# Patient Record
Sex: Female | Born: 1995 | Race: White | Hispanic: No | Marital: Single | Smoking: Never smoker
Health system: Southern US, Community
[De-identification: ages and names within clinical notes are randomized; demographics above are authoritative.]

---

## 2017-02-01 ENCOUNTER — Emergency Department
Admission: EM | Admit: 2017-02-01 | Discharge: 2017-02-01 | Disposition: A | Discharge status: Home / Self Care | Payer: BLUE CROSS/BLUE SHIELD | Attending: Emergency Medicine | Admitting: Emergency Medicine

## 2017-02-01 ENCOUNTER — Emergency Department: Payer: BLUE CROSS/BLUE SHIELD

## 2017-02-01 ENCOUNTER — Other Ambulatory Visit: Payer: Self-pay

## 2017-02-01 ENCOUNTER — Encounter: Payer: Self-pay | Admitting: Emergency Medicine

## 2017-02-01 DIAGNOSIS — K5900 Constipation, unspecified: Secondary | ICD-10-CM | POA: Insufficient documentation

## 2017-02-01 DIAGNOSIS — R110 Nausea: Secondary | ICD-10-CM | POA: Insufficient documentation

## 2017-02-01 DIAGNOSIS — R109 Unspecified abdominal pain: Secondary | ICD-10-CM | POA: Diagnosis present

## 2017-02-01 DIAGNOSIS — R1031 Right lower quadrant pain: Secondary | ICD-10-CM | POA: Diagnosis not present

## 2017-02-01 LAB — CBC
HCT: 42 % (ref 35.0–47.0)
HEMOGLOBIN: 14.1 g/dL (ref 12.0–16.0)
MCH: 30.4 pg (ref 26.0–34.0)
MCHC: 33.4 g/dL (ref 32.0–36.0)
MCV: 90.8 fL (ref 80.0–100.0)
Platelets: 255 10*3/uL (ref 150–440)
RBC: 4.63 MIL/uL (ref 3.80–5.20)
RDW: 12.5 % (ref 11.5–14.5)
WBC: 14.8 10*3/uL — AB (ref 3.6–11.0)

## 2017-02-01 LAB — URINALYSIS, COMPLETE (UACMP) WITH MICROSCOPIC
BILIRUBIN URINE: NEGATIVE
Bacteria, UA: NONE SEEN
Glucose, UA: NEGATIVE mg/dL
HGB URINE DIPSTICK: NEGATIVE
Ketones, ur: NEGATIVE mg/dL
Leukocytes, UA: NEGATIVE
NITRITE: NEGATIVE
PROTEIN: NEGATIVE mg/dL
Specific Gravity, Urine: 1.013 (ref 1.005–1.030)
pH: 6 (ref 5.0–8.0)

## 2017-02-01 LAB — COMPREHENSIVE METABOLIC PANEL
ALK PHOS: 51 U/L (ref 38–126)
ALT: 14 U/L (ref 14–54)
ANION GAP: 7 (ref 5–15)
AST: 25 U/L (ref 15–41)
Albumin: 4.8 g/dL (ref 3.5–5.0)
BILIRUBIN TOTAL: 1.1 mg/dL (ref 0.3–1.2)
BUN: 8 mg/dL (ref 6–20)
CALCIUM: 9.8 mg/dL (ref 8.9–10.3)
CO2: 25 mmol/L (ref 22–32)
Chloride: 106 mmol/L (ref 101–111)
Creatinine, Ser: 0.7 mg/dL (ref 0.44–1.00)
GFR calc non Af Amer: >60 mL/min (ref >60)
Glucose, Bld: 97 mg/dL (ref 65–99)
Potassium: 4 mmol/L (ref 3.5–5.1)
SODIUM: 138 mmol/L (ref 135–145)
TOTAL PROTEIN: 7.9 g/dL (ref 6.5–8.1)

## 2017-02-01 LAB — LIPASE, BLOOD: Lipase: 36 U/L (ref 11–51)

## 2017-02-01 LAB — POCT PREGNANCY, URINE: PREG TEST UR: NEGATIVE

## 2017-02-01 MED ORDER — ONDANSETRON HCL 4 MG/2ML IJ SOLN
4.0000 mg | Freq: Once | INTRAMUSCULAR | Status: AC
  Administered 2017-02-01: 4 mg via INTRAVENOUS
  Filled 2017-02-01: qty 2

## 2017-02-01 MED ORDER — IOPAMIDOL (ISOVUE-300) INJECTION 61%
100.0000 mL | Freq: Once | INTRAVENOUS | Status: AC | PRN
  Administered 2017-02-01: 100 mL via INTRAVENOUS

## 2017-02-01 MED ORDER — KETOROLAC TROMETHAMINE 30 MG/ML IJ SOLN
15.0000 mg | Freq: Once | INTRAMUSCULAR | Status: AC
  Administered 2017-02-01: 15 mg via INTRAVENOUS
  Filled 2017-02-01: qty 1

## 2017-02-01 MED ORDER — SODIUM CHLORIDE 0.9 % IV BOLUS (SEPSIS)
500.0000 mL | Freq: Once | INTRAVENOUS | Status: AC
  Administered 2017-02-01: 500 mL via INTRAVENOUS

## 2017-02-01 NOTE — ED Triage Notes (Signed)
Abdominal pain this am which was severe x 2 hours. States has decreased now but still present.

## 2017-02-01 NOTE — Discharge Instructions (Signed)
You have been seen in the Emergency Department (ED) for abdominal pain.  Your evaluation did not identify a clear cause of your symptoms but was generally reassuring.  Abdominal pain has many possible causes. Some aren't serious and get better on their own in a few days. Others need more testing and treatment. If your pain continues or gets worse, you need to be rechecked and may need more tests to find out what is wrong. You may need surgery to correct the problem.   Follow up with your doctor or come back to the ER in 12-24 hours if you are still having abdominal pain. Otherwise follow up in 1-3 days for a re-check. Return to the ER immediately if your pain gets worse, if you develop a fever or if you start to vomit.  In the meantime, in order to treat the constipation, dissolve 3 caps full of Miralax into 500cc of water/juice/gatorade and take it over the course of the day. Repeat for 3-5 days as needed for constipation. Make sure to drink plenty of liquids as Miralax can cause dehydration. You may also try over the counter probiotics on a daily basis and increase fiber in your diet to help relieve the constipation.  Follow-up with their pediatrician in 12-24 hours if your child is still having abdominal pain otherwise follow up in 2-3 days to make sure they are improving.  Return to the emergency department if your child has multiple episodes of vomiting and or diarrhea concerning for dehydration (sunken eyes, crying without tears, decreased level of activity, dry mouth), has green vomit, blood in the vomit, blood in the bowel movements, develops fever > 101, has new or changing abdominal pain that is worse in one specific area especially the right lower quadrant, appears lethargic or difficult to wake up, or has any other signs that concern you.  Don't ignore new symptoms, such as fever, nausea and vomiting, new or worsening abdominal pain, urination problems, bloody diarrhea or bloody stools, black  tarry stools, uncontrollable nausea and vomiting, and dizziness. These may be signs of a more serious problem. If you develop any of these you should be seen by your doctor immediately or return to the ED.   How can you care for yourself at home?  Rest until you feel better.  To prevent dehydration, drink plenty of fluids, enough so that your urine is light yellow or clear like water. Choose water and other caffeine-free clear liquids until you feel better. If you have kidney, heart, or liver disease and have to limit fluids, talk with your doctor before you increase the amount of fluids you drink.  If your stomach is upset, eat mild foods, such as rice, dry toast or crackers, bananas, and applesauce. Try eating several small meals instead of two or three large ones.  Wait until 48 hours after all symptoms have gone away before you have spicy foods, alcohol, and drinks that contain caffeine.  Do not eat foods that are high in fat.  Avoid anti-inflammatory medicines such as aspirin, ibuprofen (Advil, Motrin), and naproxen (Aleve). These can cause stomach upset. Talk to your doctor if you take daily aspirin for another health problem.  When should you call for help?  Call 911 anytime you think you may need emergency care. For example, call if:  You passed out (lost consciousness).  You pass maroon or very bloody stools.  You vomit blood or what looks like coffee grounds.  You have new, severe belly pain.  Call your doctor now or seek immediate medical care if:  Your pain gets worse, especially if it becomes focused in one area of your belly.  You have a new or higher fever.  Your stools are black and look like tar, or they have streaks of blood.  You have unexpected vaginal bleeding.  You have symptoms of a urinary tract infection. These may include:  Pain when you urinate.  Urinating more often than usual.  Blood in your urine. You are dizzy or lightheaded, or you feel like you may  faint. Watch closely for changes in your health, and be sure to contact your doctor if:  You are not getting better after 1 day (24 hours).

## 2017-02-01 NOTE — Consult Note (Signed)
SURGICAL CONSULTATION NOTE (initial) - cpt: 99244  HISTORY OF PRESENT ILLNESS (HPI):  21 y.o. female [facility] for evaluation of abdominal pain. Patient reports generalized abdominal pain worst in her RUQ awoke her from sleep at 10 am this morning, 2 hours earlier than she normally awakes on Saturdays, and then became more focused at her RLQ before improving to 2/10 intermittent crampy abdominal pain, but not completely resolving, for which [facility]. While in the ED, patient has not received any additional pain medication since being given a single dose of Toradol 15 mg 3 hours ago. Patient also reports mild nausea without emesis and states she hasn't yet had a BM today and has not been passing normal amounts of flatus throughout the day. She otherwise denies any fever/chills, burning with urination, or prior similar episodes.  Surgery is consulted by ED physician Dr. Don PerkingVeronese in this context for evaluation and management of RLQ abdominal pain.  PAST MEDICAL HISTORY (PMH):  History reviewed. No pertinent past medical history.   PAST SURGICAL HISTORY (PSH):  History reviewed. No pertinent surgical history.   MEDICATIONS:  Prior to Admission medications   Not on File     ALLERGIES:  No Known Allergies   SOCIAL HISTORY:  Social History   Socioeconomic History  . Marital status: Single    Spouse name: Not on file  . Number of children: Not on file  . Years of education: Not on file  . Highest education level: Current Archivistcollege student at [employer]  Social Needs  . Financial resource strain: Not on file  . Food insecurity - worry: Not on file  . Food insecurity - inability: Not on file  . Transportation needs - medical: Not on file  . Transportation needs - non-medical: Not on file  Occupational History  . Not on file  Tobacco Use  . Smoking status: Never Smoker  Substance and Sexual Activity  . Alcohol use: Not on file  . Drug use: Not on file   . Sexual activity: Not on file  Other Topics Concern  . Not on file  Social History Narrative  . Not on file    The patient currently resides (home / rehab facility / nursing home): Home The patient normally is (ambulatory / bedbound): Ambulatory   FAMILY HISTORY:  No family history on file.   REVIEW OF SYSTEMS:  Constitutional: denies weight loss, fever, chills, or sweats  Eyes: denies any other vision changes, history of eye injury  ENT: denies sore throat, hearing problems  Respiratory: denies shortness of breath, wheezing  Cardiovascular: denies chest pain, palpitations  Gastrointestinal: abdominal pain, N/V, and bowel function as per HPI Genitourinary: denies burning with urination or urinary frequency Musculoskeletal: denies any other joint pains or cramps  Skin: denies any other rashes or skin discolorations  Neurological: denies any other headache, dizziness, weakness  Psychiatric: denies any other depression, anxiety   All other review of systems were negative   VITAL SIGNS:  Temp:  [97.7 F (36.5 C)] 97.7 F (36.5 C) (11/10 1540) Pulse Rate:  [87-89] 89 (11/10 1957) Resp:  [20] 20 (11/10 1957) BP: (120-133)/(76-82) 120/82 (11/10 1957) SpO2:  [96 %-100 %] 100 % (11/10 1957) Weight:  [150 lb (68 kg)] 150 lb (68 kg) (11/10 1542)     Height: 5\' 11"  (180.3 cm) Weight: 150 lb (68 kg) BMI (Calculated): 20.93   INTAKE/OUTPUT:  This shift: No intake/output data recorded.  Last 2 shifts: @IOLAST2SHIFTS @  PHYSICAL EXAM:  Constitutional:  -- Normal body habitus  -- Awake, alert, and oriented x3, no apparent distress Eyes:  -- Pupils equally round and reactive to light  -- No scleral icterus, B/L no occular discharge Ear, nose, throat: -- Neck is FROM WNL -- No jugular venous distension  Pulmonary:  -- No wheezes or rhales -- Equal breath sounds bilaterally -- Breathing non-labored at rest Cardiovascular:  -- S1, S2 present  -- No pericardial rubs   Gastrointestinal:  -- Abdomen soft and non-distended with mild LLQ tenderness to palpation, RLQ currently non-tender, no guarding or rebound tenderness -- No abdominal masses appreciated, pulsatile or otherwise  Musculoskeletal and Integumentary:  -- Wounds or skin discoloration: None appreciated -- Extremities: B/L UE and LE FROM, hands and feet warm, no edema  Neurologic:  -- Motor function: Intact and symmetric -- Sensation: Intact and symmetric Psychiatric:  -- Mood and affect WNL  Labs:  CBC Latest Ref Rng & Units 02/01/2017  WBC 3.6 - 11.0 K/uL 14.8(H)  Hemoglobin 12.0 - 16.0 g/dL 96.0  Hematocrit 45.4 - 47.0 % 42.0  Platelets 150 - 440 K/uL 255   CMP Latest Ref Rng & Units 02/01/2017  Glucose 65 - 99 mg/dL 97  BUN 6 - 20 mg/dL 8  Creatinine 0.98 - 1.19 mg/dL 1.47  Sodium 829 - 562 mmol/L 138  Potassium 3.5 - 5.1 mmol/L 4.0  Chloride 101 - 111 mmol/L 106  CO2 22 - 32 mmol/L 25  Calcium 8.9 - 10.3 mg/dL 9.8  Total Protein 6.5 - 8.1 g/dL 7.9  Total Bilirubin 0.3 - 1.2 mg/dL 1.1  Alkaline Phos 38 - 126 U/L 51  AST 15 - 41 U/L 25  ALT 14 - 54 U/L 14   Imaging studies:  CT Abdomen and Pelvis with Contrast (02/01/2017) - personally reviewed and discussed with patient and her friends (bedside) 1. Moderate free fluid in the pelvis is nonspecific but may be physiologic. 2. The appendix is not seen but there is no secondary evidence of appendicitis. 3. Two calcifications in the nondependent bladder are nonspecific. There is no associated mass or wall thickening. These may be from previous inflammation. These are of doubtful acute significance. 4. Moderate fecal loading in the colon.  Trans-Abdominal and Trans-Vaginal Pelvic Ultrasound (02/01/2017) 1. The uterus and ovaries are normal in appearance. Symmetric ovarian blood flow identified. The small amount of free fluid in the pelvis is nonspecific but may simply be physiologic.   Assessment/Plan: (ICD-10's: R10/31) 21  y.o. otherwise female with variable improved crampy intermittent abdominal pain associated with constipation and non-visualization of patient's appendix without surrounding inflammatory changes and mild-moderate leukocytosis (WBC 14.8).   - considering patient's improved crampy intermittent abdominal pain and lack of peri-appendiceal inflammatory changes on CT imaging, acute appendicitis appears less likely, though cannot be entirely excluded  - also considering patient's prolonged ED workup, she has essentially been monitored/observed without antibiotics or additional pain medication   - currently no indication for urgent or emergent surgical intervention at this time, patient expresses she'd like to go home  - if discharged home, recommend close follow-up within the next 48 hours or so and consider bowel regimen  - indications for returning to ED or PMD discussed with patient and her friends (bedside)  - antibiotics and disposition as per ED physician  All of the above findings and recommendations were discussed with the patient and her friends (bedside), and all of patient's and her friends' questions were answered to their expressed satisfaction.  Thank you for the opportunity to participate in this patient's care.   -- Scherrie GerlachJason E. Earlene Plateravis, MD, RPVI [place]: [facility] General Surgery - Partnering for exceptional care. Office: [phone]

## 2017-02-01 NOTE — ED Provider Notes (Signed)
[facility] Emergency Department Provider Note  ____________________________________________  Time seen: Approximately 6:01 PM  I have reviewed the triage vital signs and the nursing notes.   HISTORY  Chief Complaint Abdominal Pain   HPI Chelsea Cox is a 21 y.o. female no significant past medical history who presents for evaluation of abdominal pain. Patient reports that she woke up this morning with a severe sharp right upper quadrant abdominal pain. The pain lasted 2-1/2 hours and has now migrated to the right lower quadrant. Her pain is now mild 2 out of 10 however she has been having intermittent episodes of moderate sharp pain. Has had nausea but no vomiting, no constipation or diarrhea, no vaginal bleeding or discharge, no dysuria or hematuria. LMP was a week ago. Patient denies prior history of kidney stones or ovarian cysts. No prior abdominal surgeries.  History reviewed. No pertinent past medical history.  There are no active problems to display for this patient.   History reviewed. No pertinent surgical history.  Prior to Admission medications   Not on File    Allergies Patient has no known allergies.  No family history on file.  Social History Social History   Tobacco Use  . Smoking status: Never Smoker  Substance Use Topics  . Alcohol use: Not on file  . Drug use: Not on file    Review of Systems  Constitutional: Negative for fever. Eyes: Negative for visual changes. ENT: Negative for sore throat. Neck: No neck pain  Cardiovascular: Negative for chest pain. Respiratory: Negative for shortness of breath. Gastrointestinal: + r sided abdominal pain and nausea. No vomiting or diarrhea. Genitourinary: Negative for dysuria. Musculoskeletal: Negative for back pain. Skin: Negative for rash. Neurological: Negative for headaches, weakness or numbness. Psych: No SI or  HI  ____________________________________________   PHYSICAL EXAM:  VITAL SIGNS: ED Triage Vitals  Enc Vitals Group     BP 02/01/17 1540 133/76     Pulse Rate 02/01/17 1540 87     Resp 02/01/17 1540 20     Temp 02/01/17 1540 97.7 F (36.5 C)     Temp Source 02/01/17 1540 Oral     SpO2 02/01/17 1540 96 %     Weight 02/01/17 1542 150 lb (68 kg)     Height 02/01/17 1542 5\' 11"  (1.803 m)     Head Circumference --      Peak Flow --      Pain Score 02/01/17 1540 2     Pain Loc --      Pain Edu? --      Excl. in GC? --     Constitutional: Alert and oriented. Well appearing and in no apparent distress. HEENT:      Head: Normocephalic and atraumatic.         Eyes: Conjunctivae are normal. Sclera is non-icteric.       Mouth/Throat: Mucous membranes are moist.       Neck: Supple with no signs of meningismus. Cardiovascular: Regular rate and rhythm. No murmurs, gallops, or rubs. 2+ symmetrical distal pulses are present in all extremities. No JVD. Respiratory: Normal respiratory effort. Lungs are clear to auscultation bilaterally. No wheezes, crackles, or rhonchi.  Gastrointestinal: Soft, ttp over the RLQ, and non distended with positive bowel sounds. No rebound or guarding. Genitourinary: No CVA tenderness. Musculoskeletal: Nontender with normal range of motion in all extremities. No edema, cyanosis, or erythema of extremities. Neurologic: Normal speech and language. Face is symmetric. Moving all extremities.  No gross focal neurologic deficits are appreciated. Skin: Skin is warm, dry and intact. No rash noted. Psychiatric: Mood and affect are normal. Speech and behavior are normal.  ____________________________________________   LABS (all labs ordered are listed, but only abnormal results are displayed)  Labs Reviewed  CBC - Abnormal; Notable for the following components:      Result Value   WBC 14.8 (*)    All other components within normal limits  URINALYSIS, COMPLETE (UACMP)  WITH MICROSCOPIC - Abnormal; Notable for the following components:   Color, Urine YELLOW (*)    APPearance CLEAR (*)    Squamous Epithelial / LPF 0-5 (*)    All other components within normal limits  LIPASE, BLOOD  COMPREHENSIVE METABOLIC PANEL  POC URINE PREG, ED  POCT PREGNANCY, URINE   ____________________________________________  EKG  none  ____________________________________________  RADIOLOGY  US renal: Normal study. No hydronephrosis or stone identified   TVUS: The uterus and ovaries are normal in appearance. Symmetric ovarian blood flow identified. The small amount of free fluid in the pelvis is nonspecific but may simply be physiologic.  CT a/p: 1. Moderate free fluid in the pelvis is nonspecific but may be physiologic. 2. The appendix is not seen but there is no secondary evidence of appendicitis. 3. Two calcifications in the nondependent bladder are nonspecific. There is no associated mass or wall thickening. These may be from previous inflammation. These are of doubtful acute significance. 4. Moderate fecal loading in the colon. ____________________________________________   PROCEDURES  Procedure(s) performed: None Procedures Critical Care performed:  None ____________________________________________   INITIAL IMPRESSION / ASSESSMENT AND PLAN / ED COURSE  21 y.o. female no significant past medical history who presents for evaluation of R sided abdominal pain since this morning  Initially in RUQ and now in RLQ associated with nausea. Patient is well-appearing, in no distress, is normal vital signs, abdomen is soft with tenderness palpation on the right lower quadrant, no rebound or guarding. Based on patient's description of the pain severe on onset, colicky in nature, and migrating my differential diagnoses includes kidney stone versus ovarian torsion versus ovarian cyst. Also the differential diagnosis is appendicitis since patient is tender on the right  lower quadrant or possible gallbladder however there is no tenderness in the right upper quadrant and patient has normal LFTs and lipase. She does have leukocytosis with white count of 14. We'll give her Toradol, Zofran, and fluids for symptom relief. We'll send patient for ultrasound renal and transvaginal to rule out ovarian torsion or hydronephrosis/kidney stone.    _________________________ 10:09 PM on 02/01/2017 -----------------------------------------  Transvaginal ultrasound negative for ovarian torsion or cysts. Renal ultrasound was also negative. Patient was sent for CT abdomen and pelvis since he continued to be tender to palpation on the right lower quadrant. CT unable to visualize the appendix however no secondary evidence of appendicitis. This patient had an elevated white count without an alternative etiology for her pain and persistent right lower quadrant tenderness surgery was consultative. Patient was evaluated by Dr. Earlene Plater who looks at her labs, imaging and during his evaluation noted the patient's tenderness had been moved from the right lower quadrant to the left lower quadrant making appendicitis less likely at this time. He recommended discharging patient home with close follow-up in 24 hours for recheck. Patient noted to have large colonic stool burden, will treat with Miralax at home. Discussed signs and symptoms of intermittent torsion or appendicitis with patient and recommended that she returns to  the emergency room if her pain gets worse, she develops fever, or nausea and vomiting. Otherwise patient's, [facility] in [facility]-24 hours for recheck of her abdominal exam and vital signs. Patient is comfortable with this plan. We'll discharge her home at this time.   Nursing notes reviewed and incorporated, Labs reviewed , Radiograph reviewed , A consult was requested and obtained from this/these consultant(s) Surgery, Notes from prior ED visits and [place] Controlled  Substance Database   Pertinent labs & imaging results that were available during my care of the patient were reviewed by me and considered in my medical decision making (see chart for details).    ____________________________________________   FINAL CLINICAL IMPRESSION(S) / ED DIAGNOSES  Final diagnoses:  Right sided abdominal pain  Constipation, unspecified constipation type      NEW MEDICATIONS STARTED DURING THIS VISIT:  This SmartLink is deprecated. Use AVSMEDLIST instead to display the medication list for a patient.   Note:  This document was prepared using Dragon voice recognition software and may include unintentional dictation errors.    Nita SickleVeronese, [place], MD 02/01/17 2212

## 2017-02-01 NOTE — ED Notes (Signed)
UA PREG result = NEG

## 2017-02-01 NOTE — ED Notes (Signed)
Ice bag applied to infiltrated right AC IV for comfort. Pt has little visible swelling noted.

## 2018-07-23 IMAGING — US US RENAL
1 series · 14 of 25 positions shown · non-contrast
Comparison: None.

CLINICAL DATA: Right-sided abdominal pain. Evaluate for renal
stone/hydronephrosis.

EXAM:
RENAL / URINARY TRACT ULTRASOUND COMPLETE

[Series 1: us renal · 0.22mm/px · 14 of 32 slices shown]
[im 1/32]
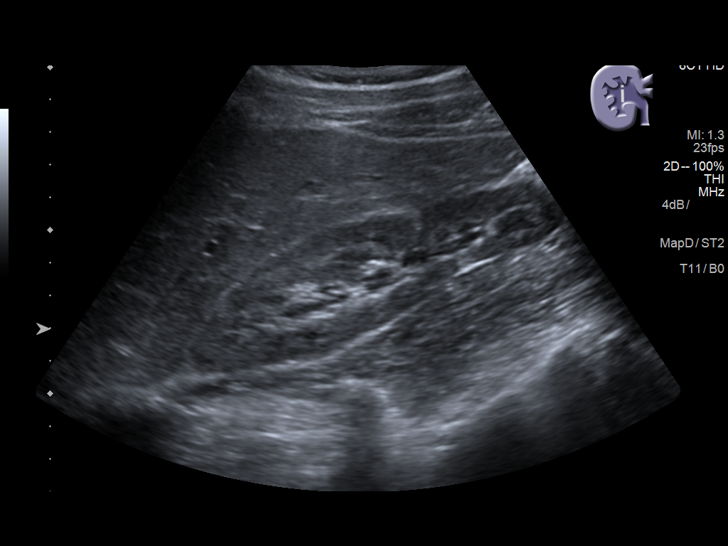
[im 3/32]
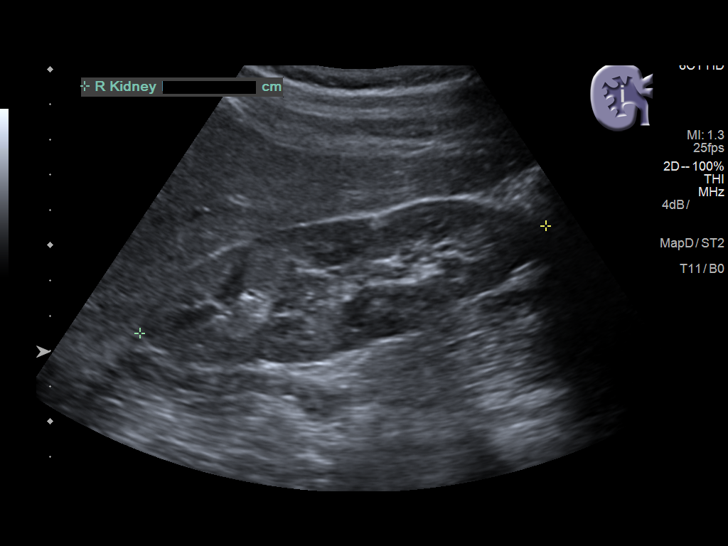
[im 6/32]
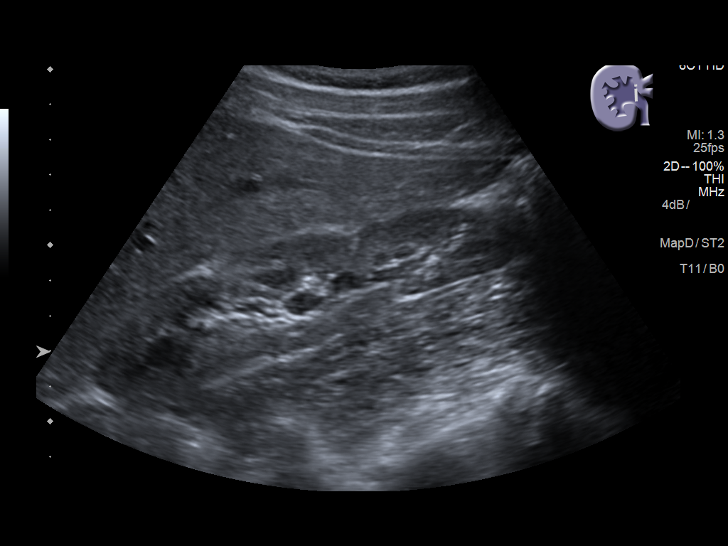
[im 8/32]
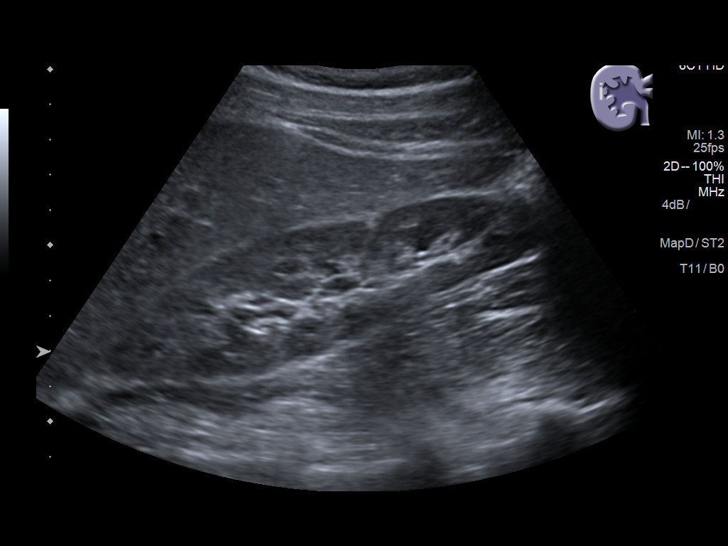
[im 11/32]
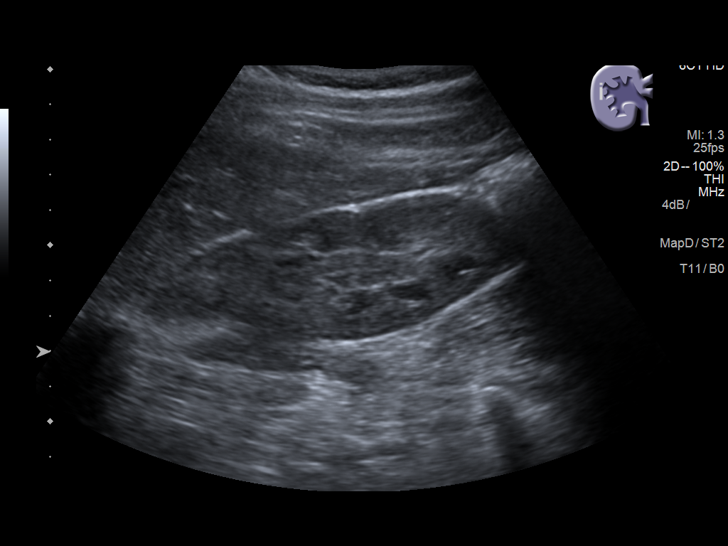
[im 12/32]
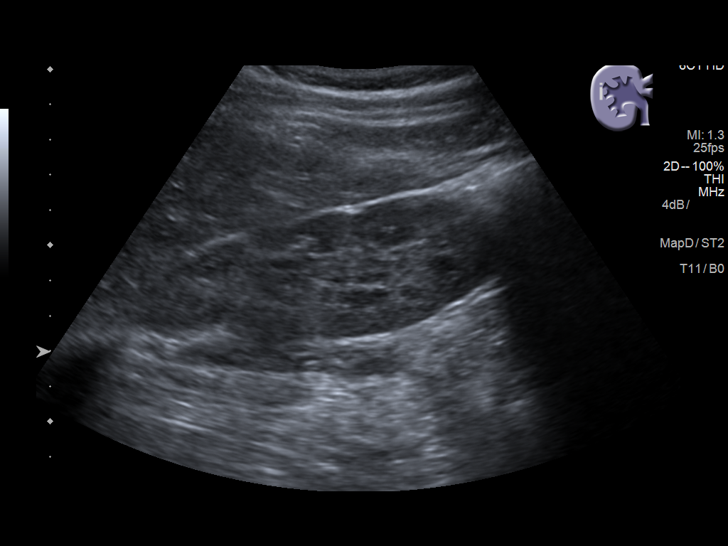
[im 15/32]
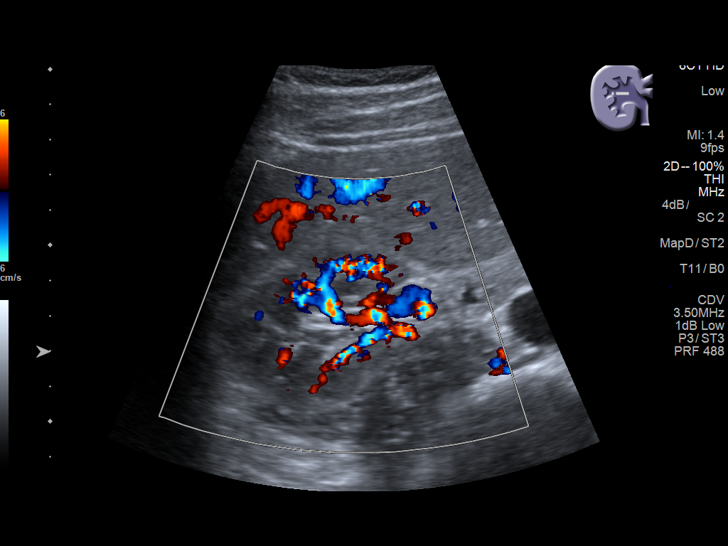
[im 17/32]
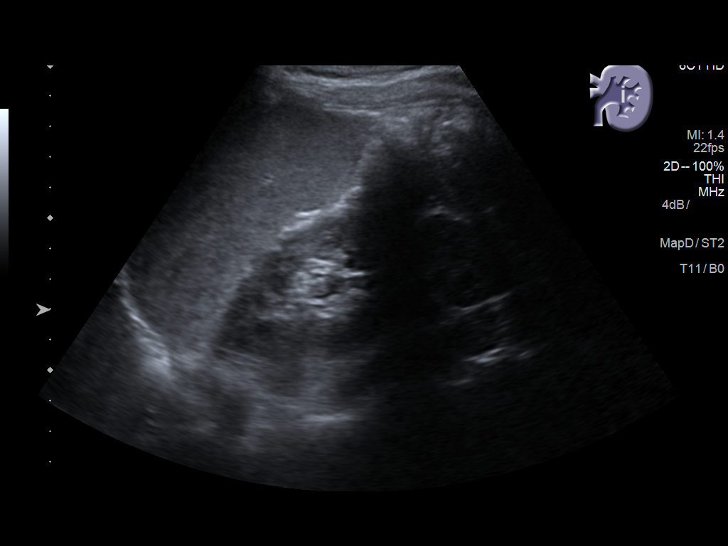
[im 20/32]
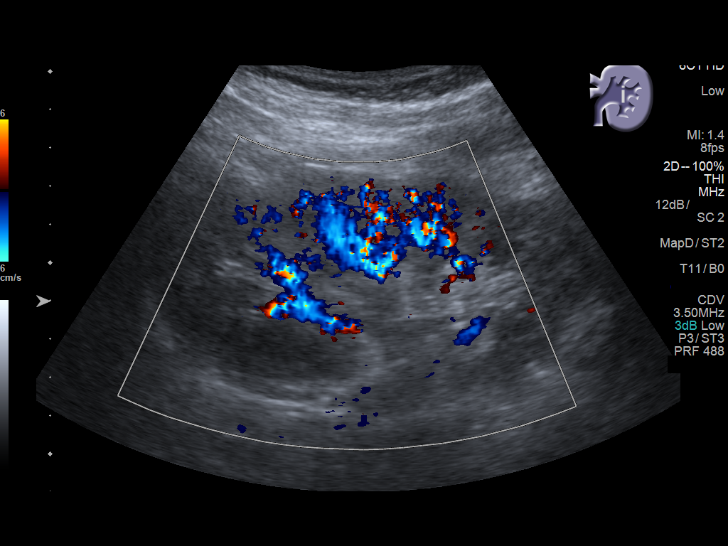
[im 21/32]
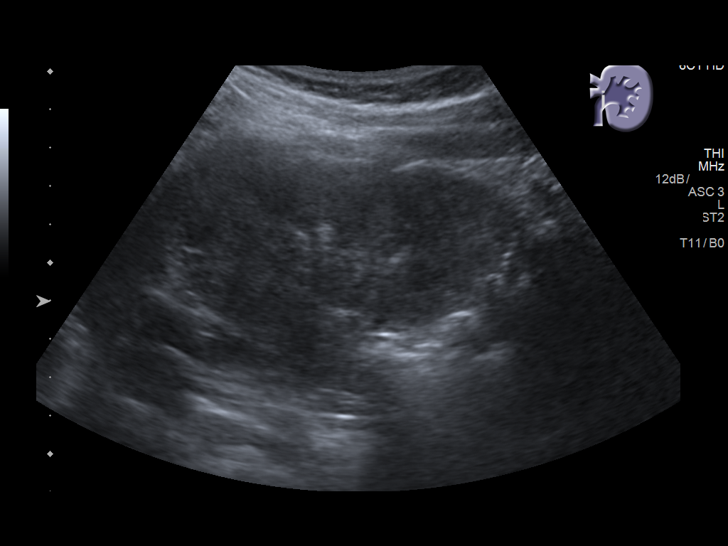
[im 24/32]
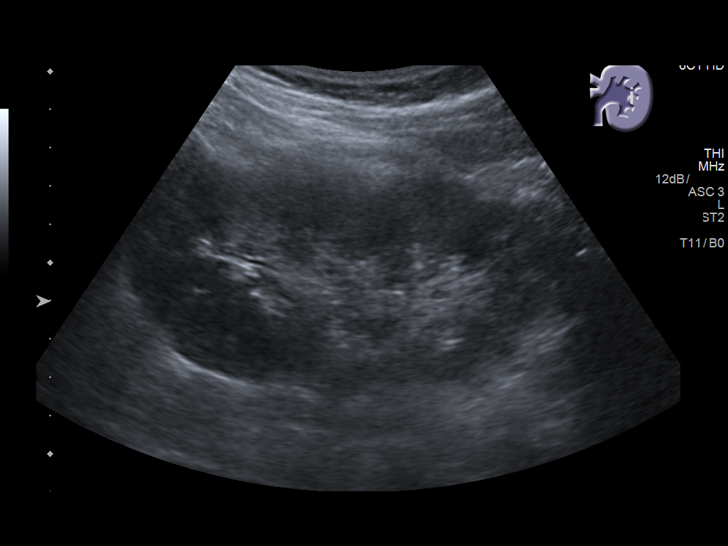
[im 26/32]
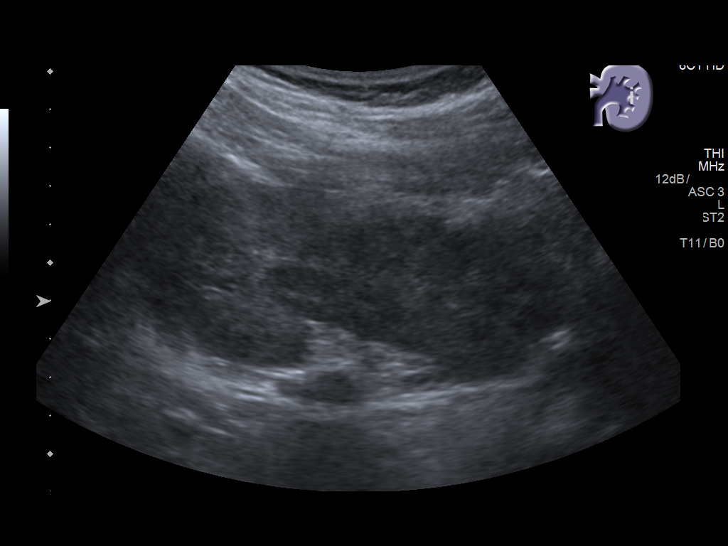
[im 29/32]
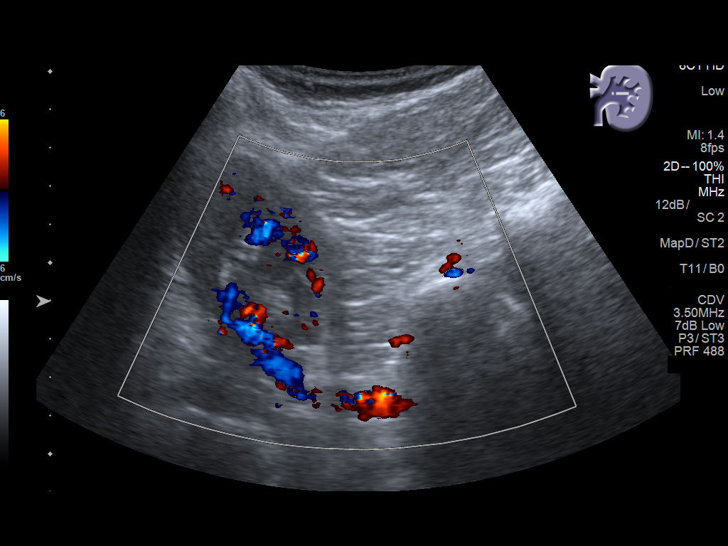
[im 32/32]
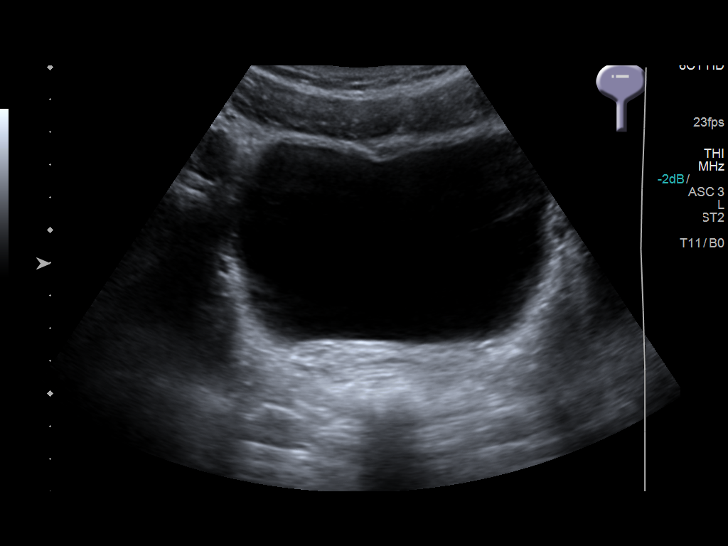

[14 of 25 positions shown; findings below may reference images not displayed]

FINDINGS: Right Kidney:

Length: 11.9 cm. Echogenicity within normal limits. No mass or
hydronephrosis visualized.

Left Kidney:

Length: 11.2 cm. Echogenicity within normal limits. No mass or
hydronephrosis visualized.

Bladder:

Appears normal for degree of bladder distention.
IMPRESSION: Normal study.  No hydronephrosis or stone identified.

## 2018-12-17 ENCOUNTER — Other Ambulatory Visit: Payer: Self-pay

## 2018-12-17 DIAGNOSIS — Z20822 Contact with and (suspected) exposure to covid-19: Secondary | ICD-10-CM

## 2018-12-18 LAB — NOVEL CORONAVIRUS, NAA: SARS-CoV-2, NAA: NOT DETECTED

## 2019-01-28 ENCOUNTER — Other Ambulatory Visit: Payer: Self-pay

## 2019-01-28 DIAGNOSIS — Z20822 Contact with and (suspected) exposure to covid-19: Secondary | ICD-10-CM

## 2019-01-29 LAB — NOVEL CORONAVIRUS, NAA: SARS-CoV-2, NAA: NOT DETECTED

## 2019-02-26 IMAGING — CT CT ABD-PELV W/ CM
2 of 4 series · 15 of 46 positions shown, 17 images · IV contrast (APPLIED)
Comparison: None.

CLINICAL DATA: Severe abdominal pain for 2 hours this morning. The
pain has decreased but remains.

EXAM:
CT ABDOMEN AND PELVIS WITH CONTRAST
TECHNIQUE: Multidetector CT imaging of the abdomen and pelvis was performed
using the standard protocol following bolus administration of
intravenous contrast.
CONTRAST:  100mL 7IEL4C-FAA IOPAMIDOL (7IEL4C-FAA) INJECTION 61%

[Series 2: routine abd/pel with · axial · 0.68mm/px · z∈[-988,-568]mm · 12 of 93 slices shown, 14 images]
[im 5/93  soft-tissue]
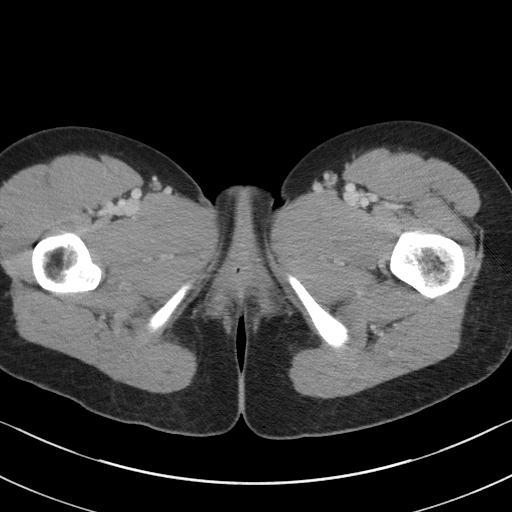
[im 5/93  bone]
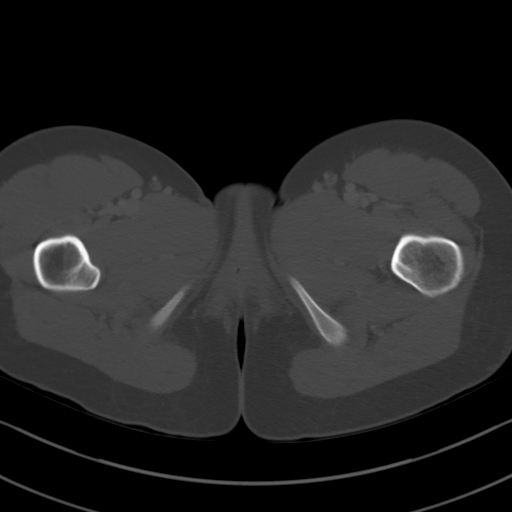
[im 13/93  soft-tissue]
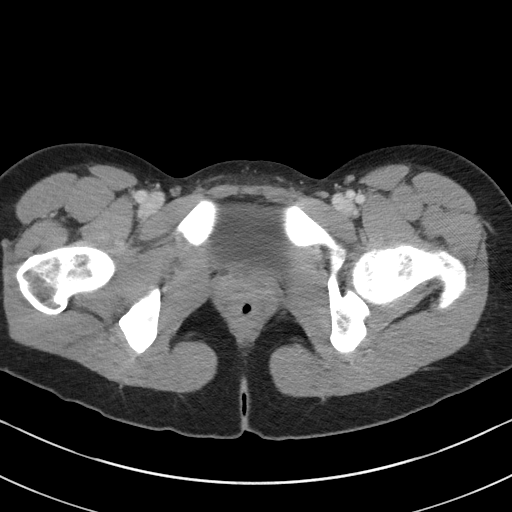
[im 21/93  soft-tissue]
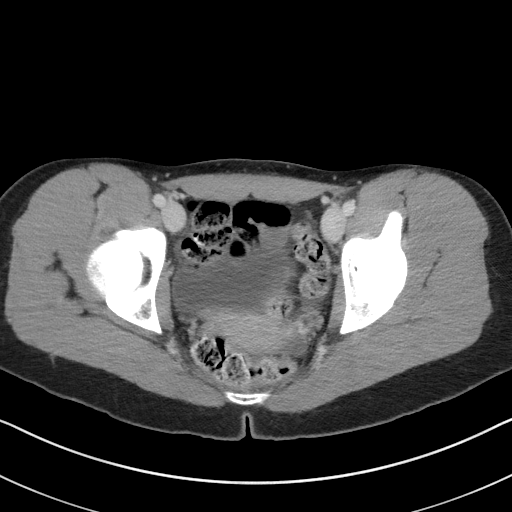
[im 29/93  soft-tissue]
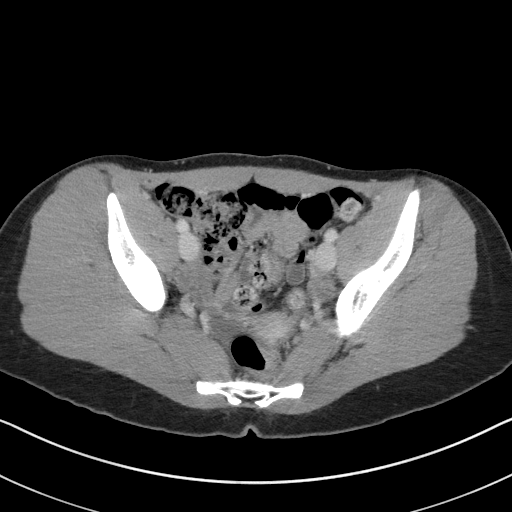
[im 37/93  soft-tissue]
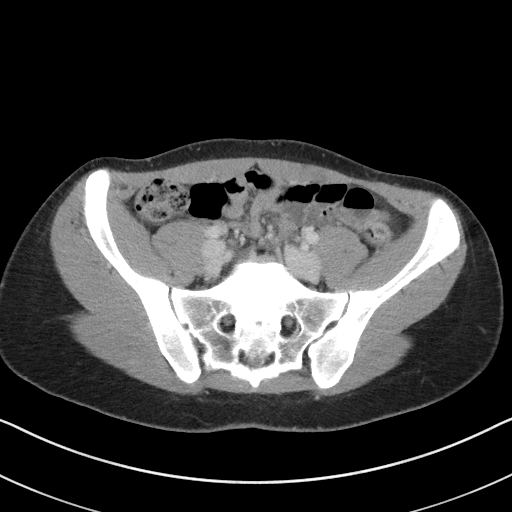
[im 45/93  soft-tissue]
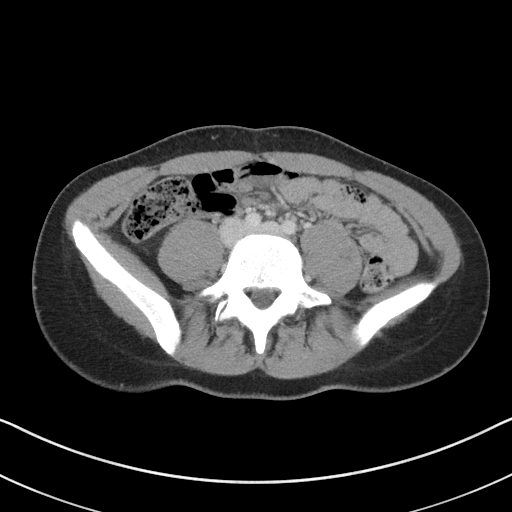
[im 49/93  soft-tissue]
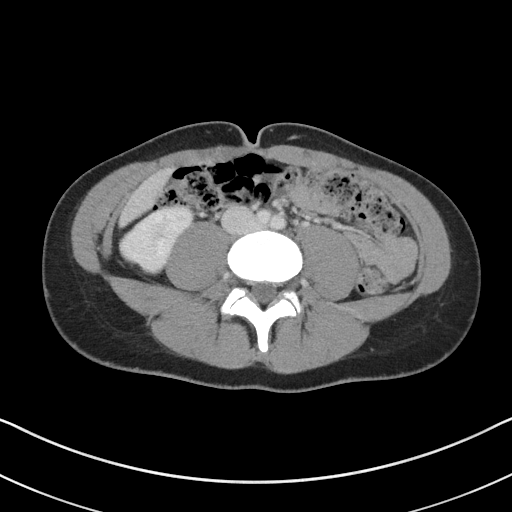
[im 57/93  soft-tissue]
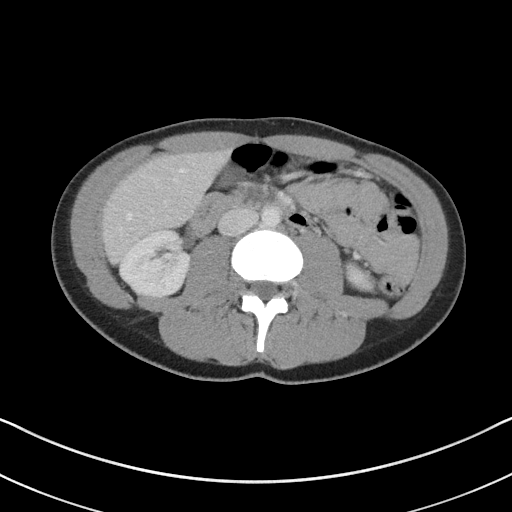
[im 65/93  soft-tissue]
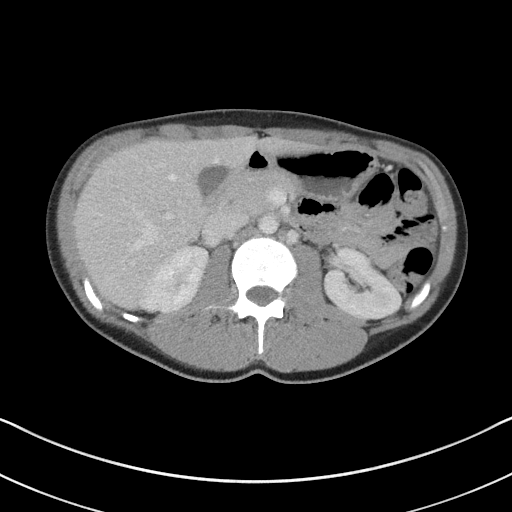
[im 65/93  bone]
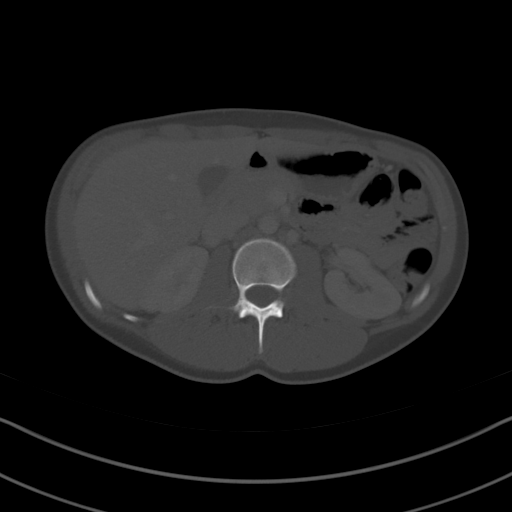
[im 73/93  soft-tissue]
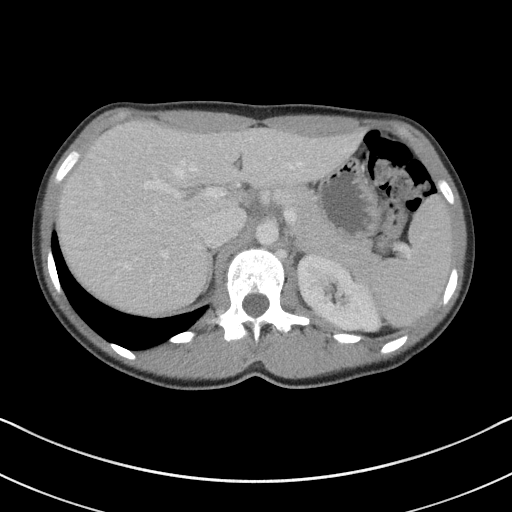
[im 81/93  soft-tissue]
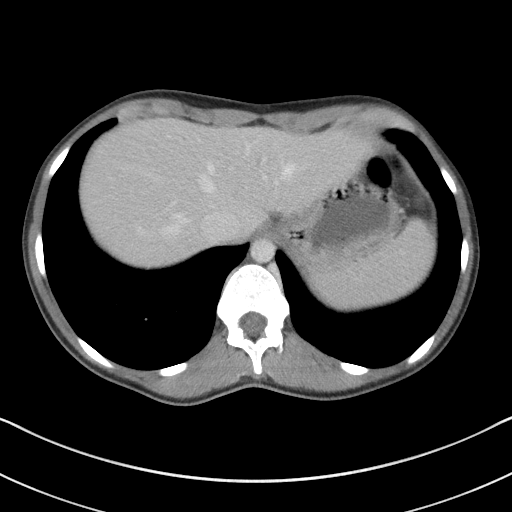
[im 89/93  soft-tissue]
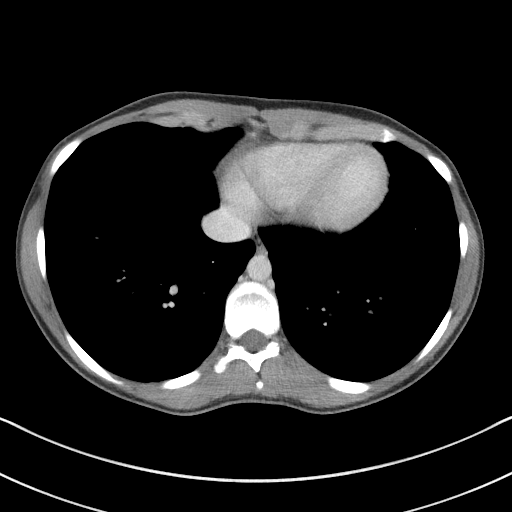

[Series 5: coronal st · coronal · 0.62mm/px · 3 of 77 slices shown]
[im 26/77  soft-tissue]
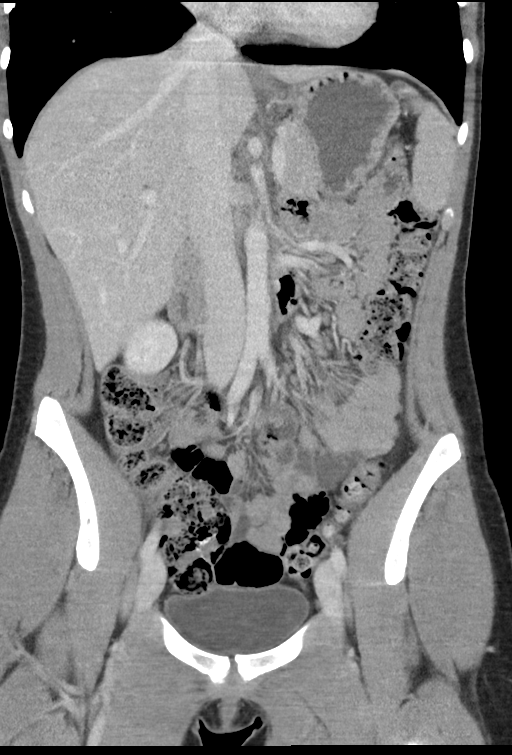
[im 34/77  soft-tissue]
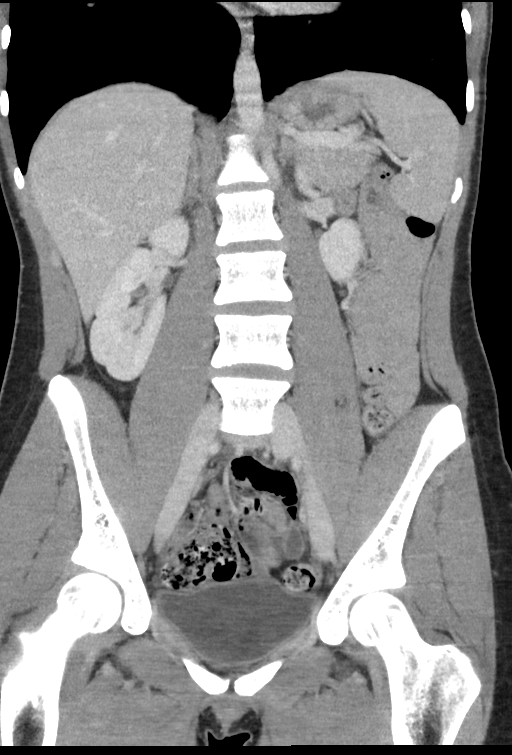
[im 43/77  soft-tissue]
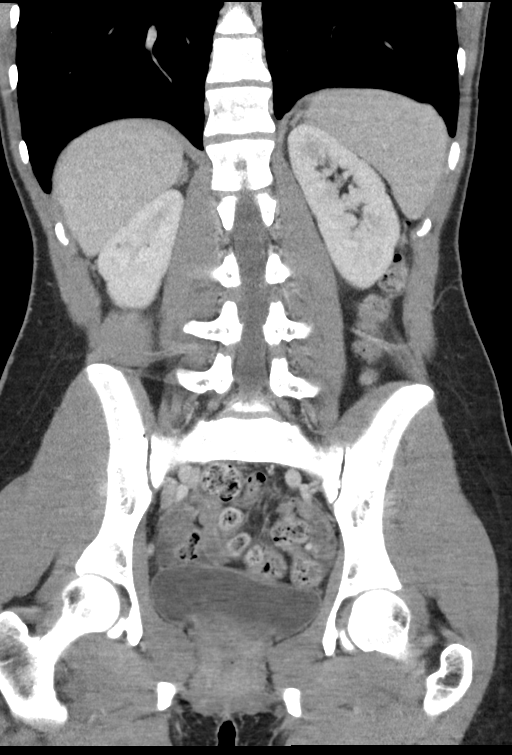

[15 of 46 positions shown; findings below may reference images not displayed]

FINDINGS: Lower chest: No acute abnormality.

Hepatobiliary: No focal liver abnormality is seen. No gallstones,
gallbladder wall thickening, or biliary dilatation.

Pancreas: Unremarkable. No pancreatic ductal dilatation or
surrounding inflammatory changes.

Spleen: Normal in size without focal abnormality.

Adrenals/Urinary Tract: The adrenal glands, kidneys, and ureters are
normal. Two adjacent calcifications are seen in the anterior bladder
on coronal image 30 and sagittal image 49. The bladder is otherwise
normal.

Stomach/Bowel: The stomach and small bowel are normal. Moderate
fecal loading is seen in the colon. The colon is otherwise normal.
The appendix is not well seen but there is no secondary evidence of
appendicitis.

Vascular/Lymphatic: No significant vascular findings are present. No
enlarged abdominal or pelvic lymph nodes.

Reproductive: Uterus and bilateral adnexa are unremarkable.

Other: Moderate free fluid in the pelvis with an attenuation of 13
Hounsfield units.

Musculoskeletal: No acute or significant osseous findings.
IMPRESSION: 1. Moderate free fluid in the pelvis is nonspecific but may be
physiologic.
2. The appendix is not seen but there is no secondary evidence of
appendicitis.
3. Two calcifications in the nondependent bladder are nonspecific.
There is no associated mass or wall thickening. These may be from
previous inflammation. These are of doubtful acute significance.
4. Moderate fecal loading in the colon.
